# Patient Record
Sex: Female | Born: 2000 | Race: White | Hispanic: Yes | Marital: Single | Smoking: Former smoker
Health system: Southern US, Community
[De-identification: ages and names within clinical notes are randomized; demographics above are authoritative.]

## PROBLEM LIST (undated history)

## (undated) DIAGNOSIS — R011 Cardiac murmur, unspecified: Secondary | ICD-10-CM

## (undated) DIAGNOSIS — R200 Anesthesia of skin: Secondary | ICD-10-CM

## (undated) DIAGNOSIS — R7989 Other specified abnormal findings of blood chemistry: Secondary | ICD-10-CM

## (undated) HISTORY — DX: Cardiac murmur, unspecified: R01.1

## (undated) HISTORY — DX: Anesthesia of skin: R20.0

## (undated) HISTORY — DX: Other specified abnormal findings of blood chemistry: R79.89

---
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
## 2020-01-02 ENCOUNTER — Ambulatory Visit: Payer: Medicaid Other | Admitting: Neurology

## 2020-02-06 ENCOUNTER — Encounter: Payer: Self-pay | Admitting: Neurology

## 2020-02-06 ENCOUNTER — Ambulatory Visit (INDEPENDENT_AMBULATORY_CARE_PROVIDER_SITE_OTHER): Payer: Medicaid Other | Admitting: Neurology

## 2020-02-06 DIAGNOSIS — M5417 Radiculopathy, lumbosacral region: Secondary | ICD-10-CM | POA: Insufficient documentation

## 2020-02-06 DIAGNOSIS — R202 Paresthesia of skin: Secondary | ICD-10-CM

## 2020-02-06 HISTORY — DX: Radiculopathy, lumbosacral region: M54.17

## 2020-02-06 NOTE — Progress Notes (Signed)
Please refer to EMG and nerve conduction procedure note.  

## 2020-02-06 NOTE — Progress Notes (Addendum)
Patient comes in for EMG and nerve conduction study evaluation.  The study shows isolated mild acute denervation of the gastrocnemius muscle on the right leg, this combined with a history and residual numbness of the right heel, the patient likely has a low-grade healing S1 radiculopathy.  I would not recommend any further evaluation at this time but if the pain recurs, MRI of the low back may need to be done.       MNC    Nerve / Sites Muscle Latency Ref. Amplitude Ref. Rel Amp Segments Distance Velocity Ref. Area    ms ms mV mV %  cm m/s m/s mVms  R Peroneal - EDB     Ankle EDB 3.9 ?6.5 7.3 ?2.0 100 Ankle - EDB 9   24.3     Fib head EDB 8.6  6.5  89.2 Fib head - Ankle 26 55 ?44 28.1     Pop fossa EDB 10.4  6.9  107 Pop fossa - Fib head 10 55 ?44 23.9         Pop fossa - Ankle      L Peroneal - EDB     Ankle EDB 3.6 ?6.5 5.4 ?2.0 100 Ankle - EDB 9   16.6     Fib head EDB 8.9  5.1  94 Fib head - Ankle 27 51 ?44 16.4     Pop fossa EDB 10.7  4.9  97.5 Pop fossa - Fib head 10 55 ?44 16.1         Pop fossa - Ankle      R Tibial - AH     Ankle AH 3.2 ?5.8 9.0 ?4.0 100 Ankle - AH 9   18.6     Pop fossa AH 10.9  8.7  96.6 Pop fossa - Ankle 32 42 ?41 18.6  L Tibial - AH     Ankle AH 3.8 ?5.8 9.9 ?4.0 100 Ankle - AH 9   19.7     Pop fossa AH 11.3  8.7  87.5 Pop fossa - Ankle 33 44 ?41 18.5             SNC    Nerve / Sites Rec. Site Peak Lat Ref.  Amp Ref. Segments Distance    ms ms V V  cm  R Sural - Ankle (Calf)     Calf Ankle 3.1 ?4.4 10 ?6 Calf - Ankle 14  L Sural - Ankle (Calf)     Calf Ankle 4.1 ?4.4 10 ?6 Calf - Ankle 14  R Superficial peroneal - Ankle     Lat leg Ankle 3.4 ?4.4 10 ?6 Lat leg - Ankle 14  L Superficial peroneal - Ankle     Lat leg Ankle 3.5 ?4.4 7 ?6 Lat leg - Ankle 14             F  Wave    Nerve F Lat Ref.   ms ms  R Tibial - AH 44.2 ?56.0  L Tibial - AH 44.2 ?56.0

## 2020-02-06 NOTE — Procedures (Signed)
° ° ° °  HISTORY:  Elizabeth Marshall is a 19 year old Hispanic female with a history of right-sided sciatica pain that seemed to resolve but left her with some numbness of the lateral right foot and heel.  The patient is being evaluated for this issue.  NERVE CONDUCTION STUDIES:  Nerve conduction studies were performed on both lower extremities. The distal motor latencies and motor amplitudes for the peroneal and posterior tibial nerves were within normal limits. The nerve conduction velocities for these nerves were also normal. The sensory latencies for the peroneal and sural nerves were within normal limits. The F wave latencies for the posterior tibial nerves were within normal limits.   EMG STUDIES:  EMG study was performed on the right lower extremity:  The tibialis anterior muscle reveals 2 to 4K motor units with full recruitment. No fibrillations or positive waves were seen. The peroneus tertius muscle reveals 2 to 4K motor units with full recruitment. No fibrillations or positive waves were seen. The medial gastrocnemius muscle reveals 1 to 3K motor units with full recruitment. 1+ positive waves were seen. The vastus lateralis muscle reveals 2 to 4K motor units with full recruitment. No fibrillations or positive waves were seen. The iliopsoas muscle reveals 2 to 4K motor units with full recruitment. No fibrillations or positive waves were seen. The biceps femoris muscle (long head) reveals 2 to 4K motor units with full recruitment. No fibrillations or positive waves were seen. The lumbosacral paraspinal muscles were tested at 3 levels, and revealed no abnormalities of insertional activity at all 3 levels tested. There was good relaxation.   IMPRESSION:  Nerve conduction studies done on both lower extremities were unremarkable, no evidence of a neuropathy was seen.  EMG evaluation of the right lower extremity shows isolated mild acute denervation of the gastrocnemius muscle.   Otherwise the study was normal.  This study may be consistent therefore with a healing mild right S1 radiculopathy.  Clinical correlation is required.  Marlan Palau MD 02/06/2020 2:07 PM  [facility] [address] [place], [place] [address]  Phone [phone] Fax [phone]

## 2020-04-18 IMAGING — CR DG ABDOMEN 1V
2 series · 2 of 2 positions shown · non-contrast
Comparison: September 02, 2018

CLINICAL DATA: Right flank pain for 2 weeks

EXAM:
ABDOMEN - 1 VIEW

[t abdomen supine (1 of 2)]
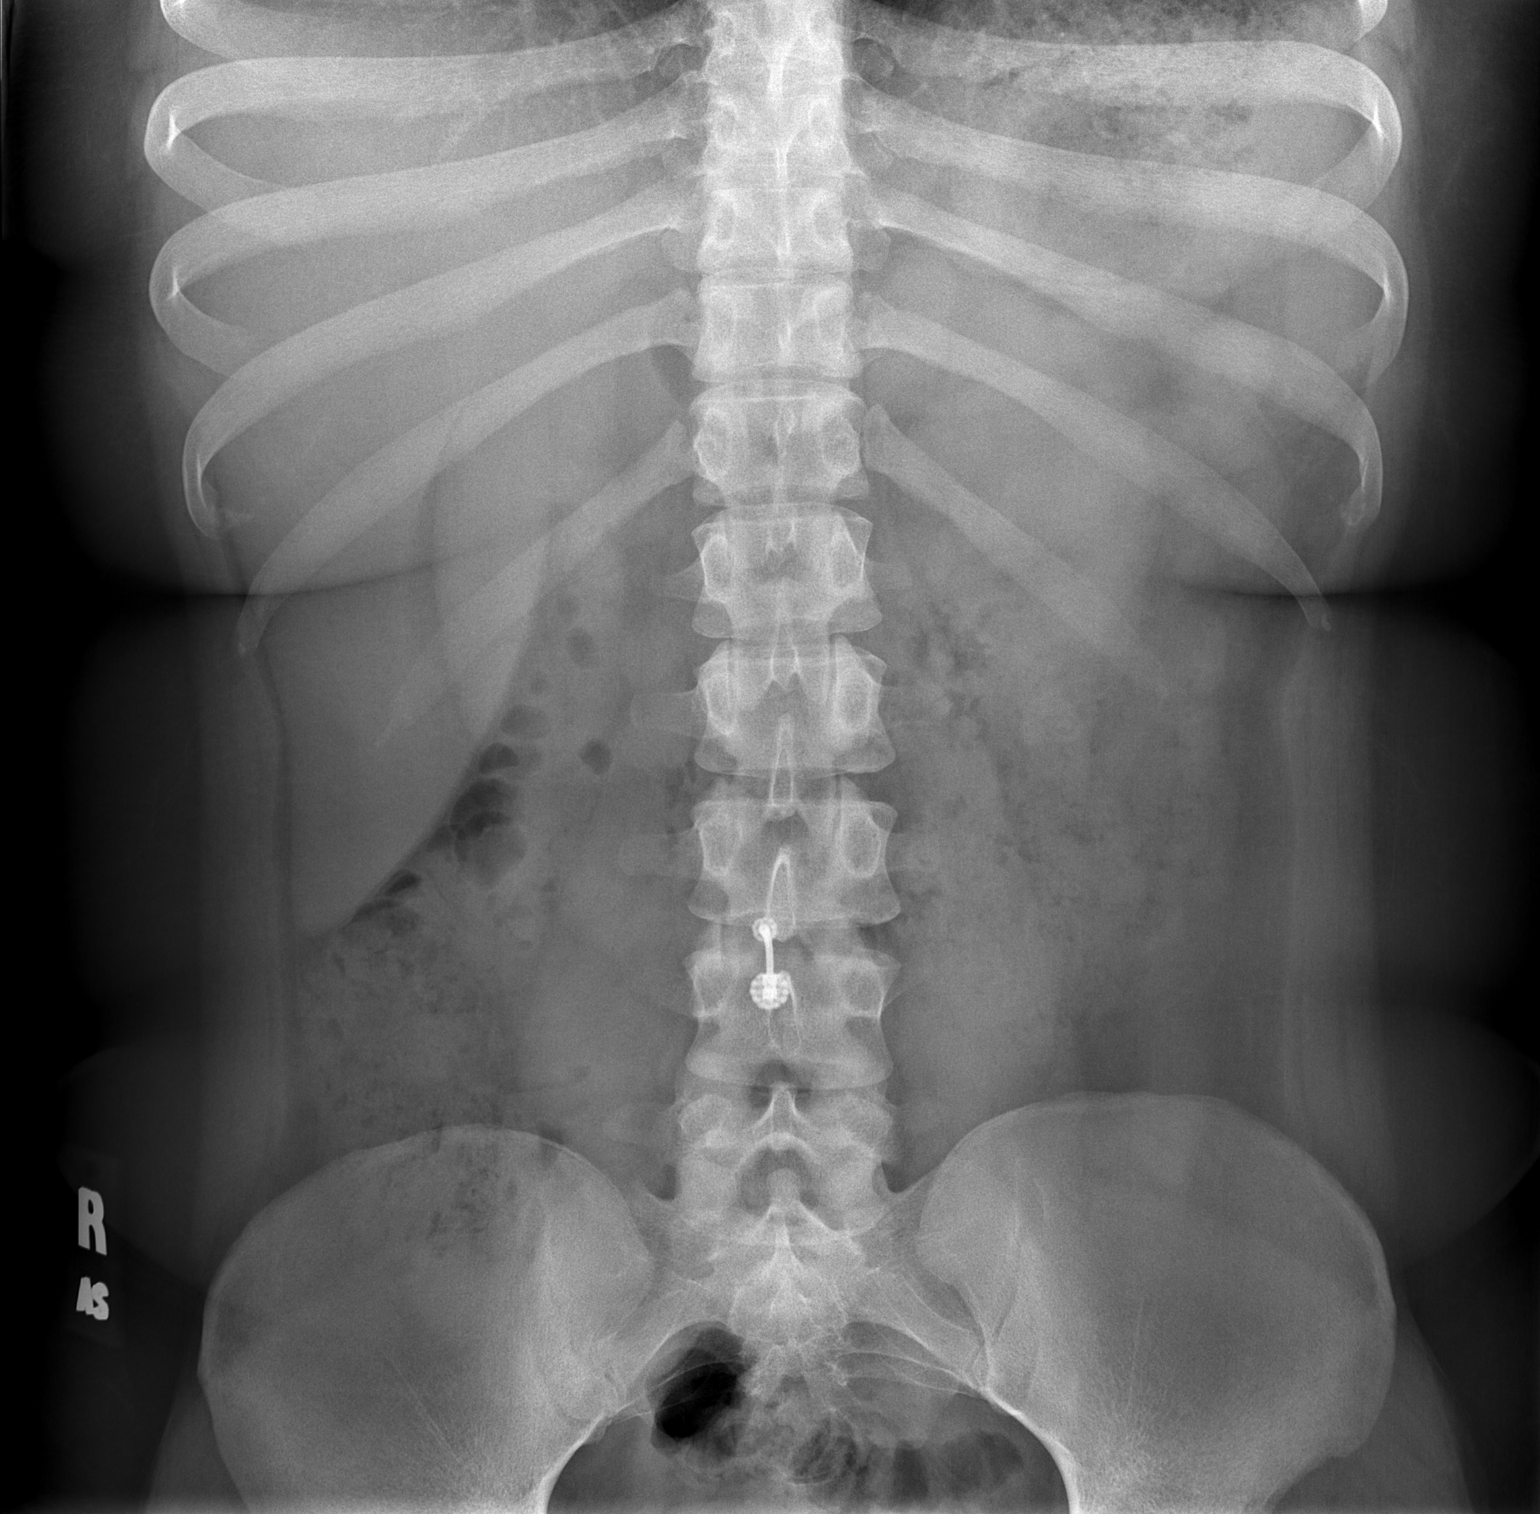

[t abdomen supine (2 of 2)]
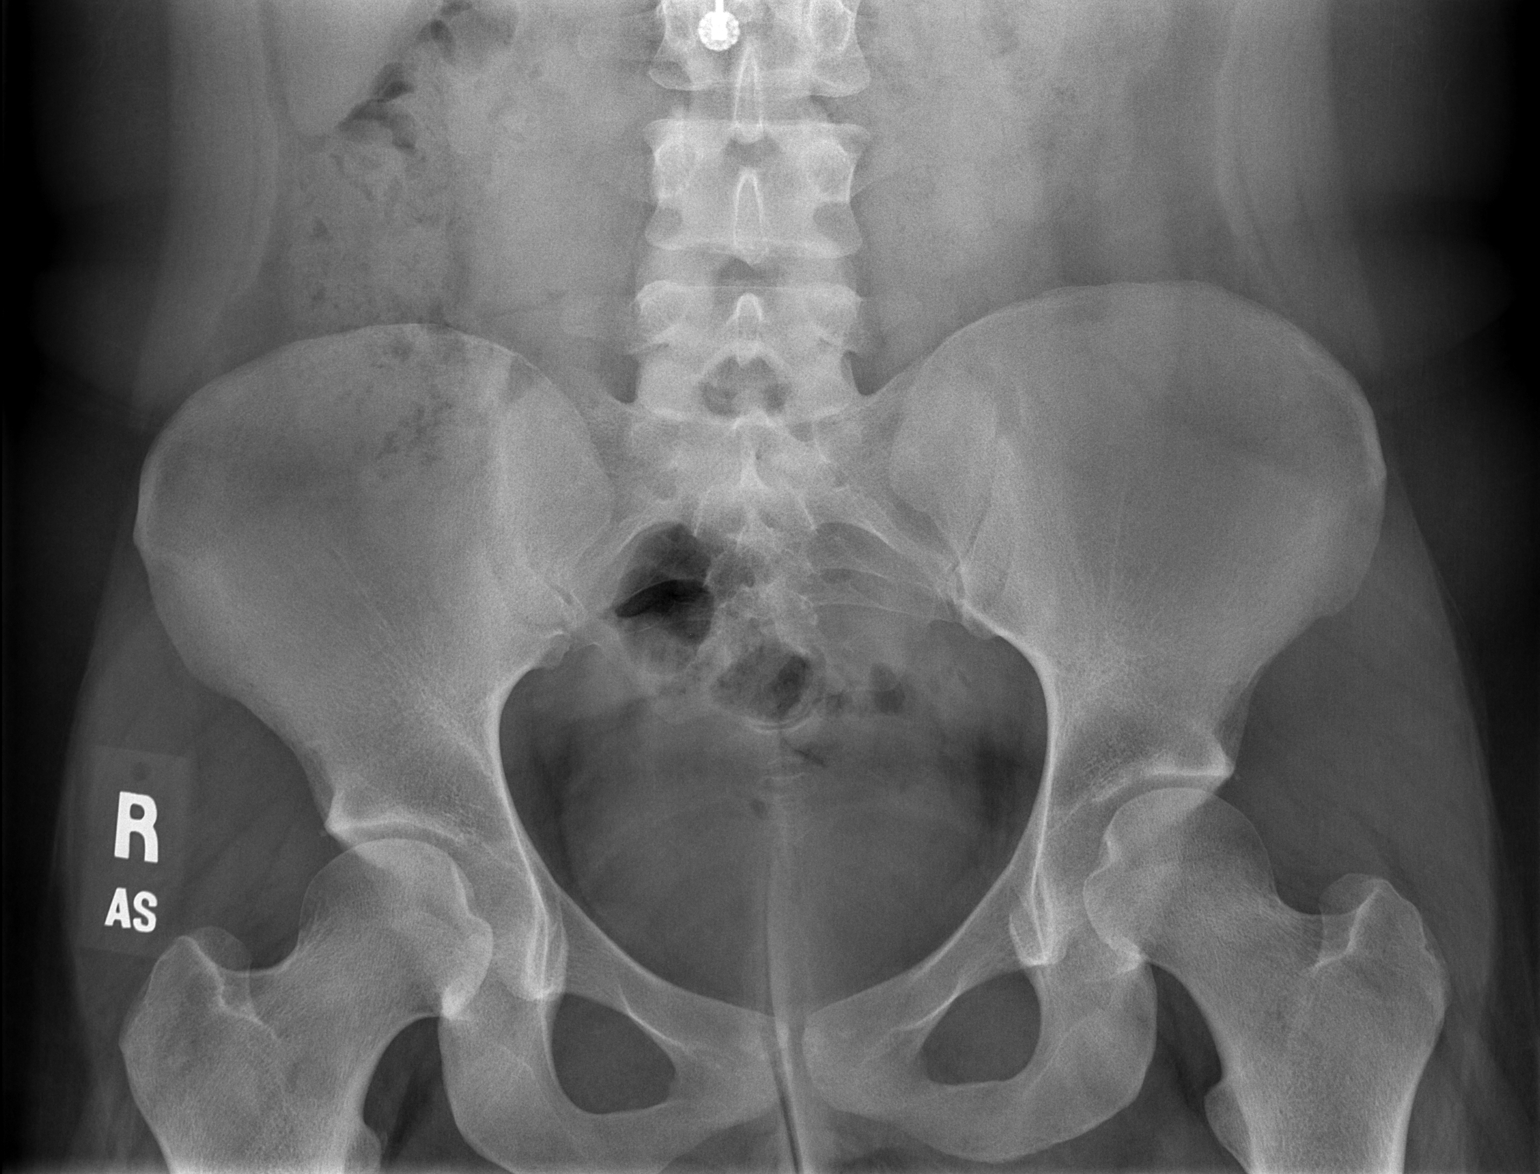

[2 of 2 positions shown; findings below may reference images not displayed]

FINDINGS: No renal or ureteral stones are noted. Mild fecal loading in the
colon. No other abnormalities.
IMPRESSION: Mild fecal loading in the colon.  No other abnormalities.

## 2020-10-02 ENCOUNTER — Encounter (INDEPENDENT_AMBULATORY_CARE_PROVIDER_SITE_OTHER): Payer: Self-pay | Admitting: Dietician

## 2022-03-22 ENCOUNTER — Other Ambulatory Visit: Payer: Self-pay

## 2022-03-22 ENCOUNTER — Emergency Department (HOSPITAL_COMMUNITY)
Admission: EM | Admit: 2022-03-22 | Discharge: 2022-03-23 | Discharge status: Against Medical Advice | Payer: Medicaid Other | Attending: Student | Admitting: Student

## 2022-03-22 DIAGNOSIS — Z5321 Procedure and treatment not carried out due to patient leaving prior to being seen by health care provider: Secondary | ICD-10-CM | POA: Diagnosis not present

## 2022-03-22 DIAGNOSIS — R519 Headache, unspecified: Secondary | ICD-10-CM | POA: Insufficient documentation

## 2022-03-22 DIAGNOSIS — N939 Abnormal uterine and vaginal bleeding, unspecified: Secondary | ICD-10-CM | POA: Insufficient documentation

## 2022-03-22 DIAGNOSIS — R509 Fever, unspecified: Secondary | ICD-10-CM | POA: Insufficient documentation

## 2022-03-22 LAB — URINALYSIS, ROUTINE W REFLEX MICROSCOPIC
Bilirubin Urine: NEGATIVE
Glucose, UA: NEGATIVE mg/dL
Ketones, ur: NEGATIVE mg/dL
Leukocytes,Ua: NEGATIVE
Nitrite: NEGATIVE
Protein, ur: 30 mg/dL — AB
Specific Gravity, Urine: 1.004 — ABNORMAL LOW (ref 1.005–1.030)
pH: 6 (ref 5.0–8.0)

## 2022-03-22 LAB — CBC WITH DIFFERENTIAL/PLATELET
Abs Immature Granulocytes: 0.01 10*3/uL (ref 0.00–0.07)
Basophils Absolute: 0.1 10*3/uL (ref 0.0–0.1)
Basophils Relative: 1 %
Eosinophils Absolute: 0.4 10*3/uL (ref 0.0–0.5)
Eosinophils Relative: 5 %
HCT: 22.6 % — ABNORMAL LOW (ref 36.0–46.0)
Hemoglobin: 7.2 g/dL — ABNORMAL LOW (ref 12.0–15.0)
Immature Granulocytes: 0 %
Lymphocytes Relative: 34 %
Lymphs Abs: 2.4 10*3/uL (ref 0.7–4.0)
MCH: 24.7 pg — ABNORMAL LOW (ref 26.0–34.0)
MCHC: 31.9 g/dL (ref 30.0–36.0)
MCV: 77.7 fL — ABNORMAL LOW (ref 80.0–100.0)
Monocytes Absolute: 0.5 10*3/uL (ref 0.1–1.0)
Monocytes Relative: 8 %
Neutro Abs: 3.8 10*3/uL (ref 1.7–7.7)
Neutrophils Relative %: 52 %
Platelets: 375 10*3/uL (ref 150–400)
RBC: 2.91 MIL/uL — ABNORMAL LOW (ref 3.87–5.11)
RDW: 14.7 % (ref 11.5–15.5)
WBC: 7.1 10*3/uL (ref 4.0–10.5)
nRBC: 0 % (ref 0.0–0.2)

## 2022-03-22 LAB — COMPREHENSIVE METABOLIC PANEL
ALT: 132 U/L — ABNORMAL HIGH (ref 0–44)
AST: 70 U/L — ABNORMAL HIGH (ref 15–41)
Albumin: 3.9 g/dL (ref 3.5–5.0)
Alkaline Phosphatase: 78 U/L (ref 38–126)
Anion gap: 6 (ref 5–15)
BUN: 5 mg/dL — ABNORMAL LOW (ref 6–20)
CO2: 25 mmol/L (ref 22–32)
Calcium: 8.8 mg/dL — ABNORMAL LOW (ref 8.9–10.3)
Chloride: 109 mmol/L (ref 98–111)
Creatinine, Ser: 0.61 mg/dL (ref 0.44–1.00)
GFR, Estimated: >60 mL/min (ref >60)
Glucose, Bld: 110 mg/dL — ABNORMAL HIGH (ref 70–99)
Potassium: 4.6 mmol/L (ref 3.5–5.1)
Sodium: 140 mmol/L (ref 135–145)
Total Bilirubin: 0.5 mg/dL (ref 0.3–1.2)
Total Protein: 7.1 g/dL (ref 6.5–8.1)

## 2022-03-22 LAB — I-STAT BETA HCG BLOOD, ED (MC, WL, AP ONLY): I-stat hCG, quantitative: <5 m[IU]/mL (ref <5)

## 2022-03-22 NOTE — ED Triage Notes (Signed)
Pt here because her period has lasted 1 mo, pt also developed fever and HA 2 days ago. Pt reports that she looks pale and feels weak.

## 2022-03-22 NOTE — ED Provider Triage Note (Signed)
Emergency Medicine Provider Triage Evaluation Note  Elizabeth Marshall , a 21 y.o. female  was evaluated in triage.  Pt complains of menstrual flow which has been ongoing for approximately 1 month.  She states it started off steady, increased in heaviness, and is decreased to spotting over the past few days.  She also endorses a headache and subjective fever yesterday and today.  She denies shortness of breath, chest pain, abdominal pain, nausea, vomiting.  She does endorse appearing pale yesterday  Review of Systems  Positive: As above Negative: As above  Physical Exam  BP (!) 140/79 (BP Location: Right Arm)   Pulse 86   Temp 98.9 F (37.2 C) (Oral)   Resp 15   SpO2 100%  Gen:   Awake, no distress   Resp:  Normal effort  MSK:   Moves extremities without difficulty  Other:    Medical Decision Making  Medically screening exam initiated at 4:46 PM.  Appropriate orders placed.  CHAITRA MAST was informed that the remainder of the evaluation will be completed by another provider, this initial triage assessment does not replace that evaluation, and the importance of remaining in the ED until their evaluation is complete.     Dorothyann Peng, PA-C 03/22/22 1646

## 2022-03-23 NOTE — ED Notes (Signed)
Called pt multiple times for vitals with no answer
